# Patient Record
Sex: Female | Born: 1965 | Race: White | Hispanic: No | Marital: Married | Smoking: Current every day smoker
Health system: Southern US, Community
[De-identification: ages and names within clinical notes are randomized; demographics above are authoritative.]

## PROBLEM LIST (undated history)

## (undated) HISTORY — PX: CHOLECYSTECTOMY: SHX55

---

## 1998-11-27 ENCOUNTER — Emergency Department (HOSPITAL_COMMUNITY): Admission: EM | Admit: 1998-11-27 | Discharge: 1998-11-27 | Payer: Self-pay | Admitting: Internal Medicine

## 1998-11-28 ENCOUNTER — Encounter: Payer: Self-pay | Admitting: Emergency Medicine

## 2003-07-27 ENCOUNTER — Inpatient Hospital Stay (HOSPITAL_COMMUNITY): Admission: EM | Admit: 2003-07-27 | Discharge: 2003-08-03 | Payer: Self-pay | Admitting: Emergency Medicine

## 2003-07-27 ENCOUNTER — Encounter (INDEPENDENT_AMBULATORY_CARE_PROVIDER_SITE_OTHER): Payer: Self-pay | Admitting: *Deleted

## 2005-04-25 IMAGING — CT CT ABDOMEN W/ CM
1 of 4 series · 14 of 32 positions shown, 19 images · IV contrast (omnipaque)
Comparison: none

CLINICAL DATA: cholecystectomy; jaundice
 CT SCAN OF THE ABDOMEN WITH CONTRAST
 Spiral scanning is performed after oral administration of diluted contrast and during intravenous administration of 911cc of Omnipaque 300.  
 The lung bases show mild dependent atelectasis.  The patient has had recent cholecystectomy with a drain in place in the subhepatic area.  There is not any unexpected amount of fluid.  There is a small amount of fluid in the subhepatic region but I do not imagine this is of any concern.  The liver parenchyma does not show any focal lesion.  No sign of biliary ductal dilatation.  The spleen, pancreas, adrenal glands and kidneys all appear normal.  The aorta and IVC are normal.  No bowel pathology is seen.  No free air.  
 IMPRESSION
 Status post cholecystectomy with the drain well positioned.  No sign of biliary ductal obstruction, focal liver lesion or any significant leakage of fluid that is not being removed by the drainage catheter.
 CT SCAN OF THE PELVIS
   Spiral scanning is performed after oral and intravenous contrast administration.  
 There is no free fluid.  The patient has either bilateral hydrosalpinx or bilateral ovarian cysts.  The uterus appears unremarkable.  No sign of mass or adenopathy.  
 No acute finding in the pelvis.  Possible bilateral hydrosalpinx or ovarian cysts.

[Series 3: — · axial · 0.60mm/px · z∈[-574,-184]mm · 14 of 90 slices shown, 19 images]
[im 6/90  soft-tissue]
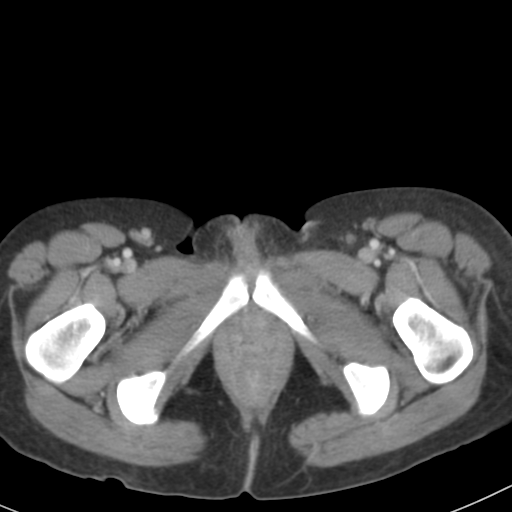
[im 6/90  bone]
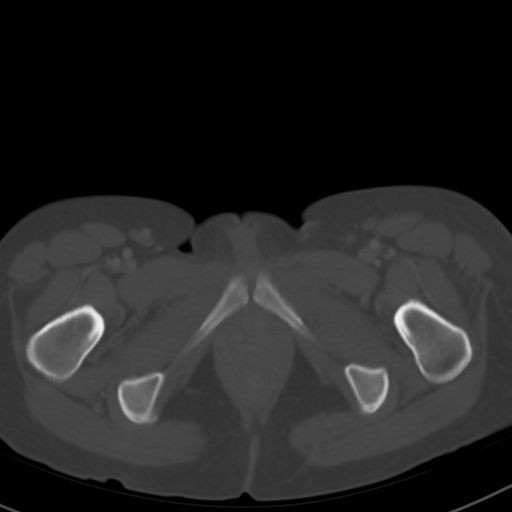
[im 12/90  soft-tissue]
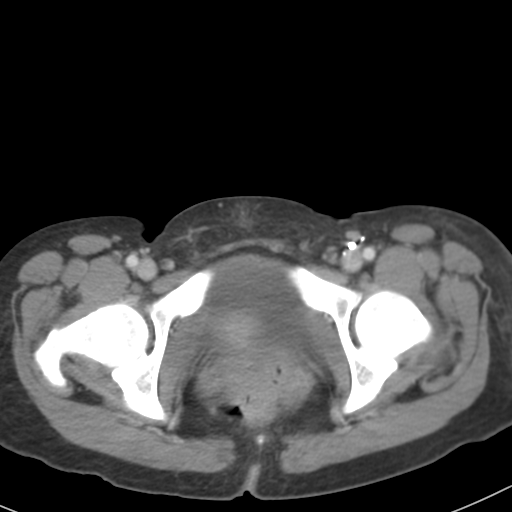
[im 17/90  soft-tissue]
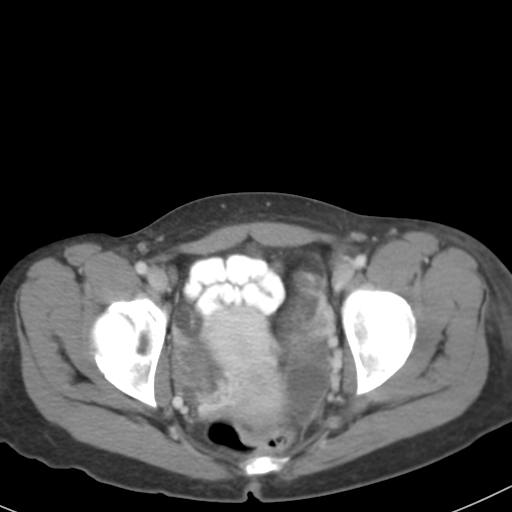
[im 28/90  soft-tissue]
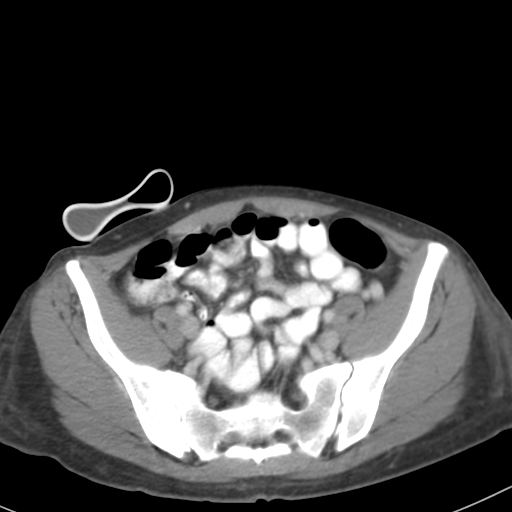
[im 34/90  soft-tissue]
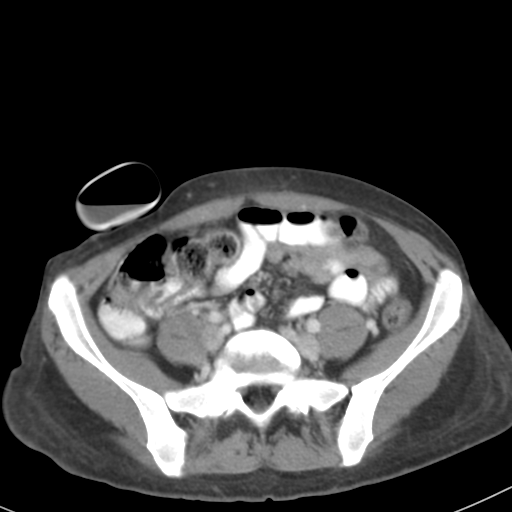
[im 39/90  soft-tissue]
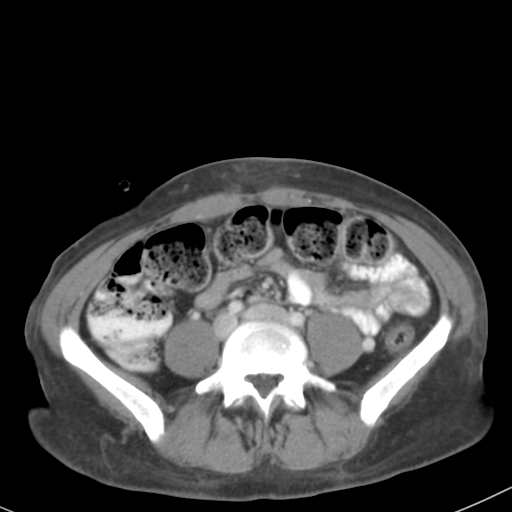
[im 45/90  soft-tissue]
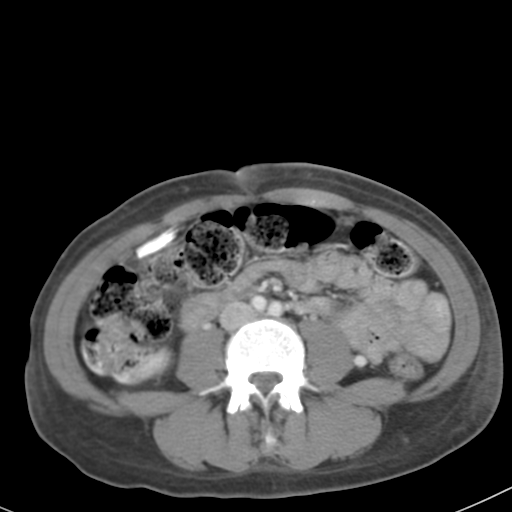
[im 51/90  soft-tissue]
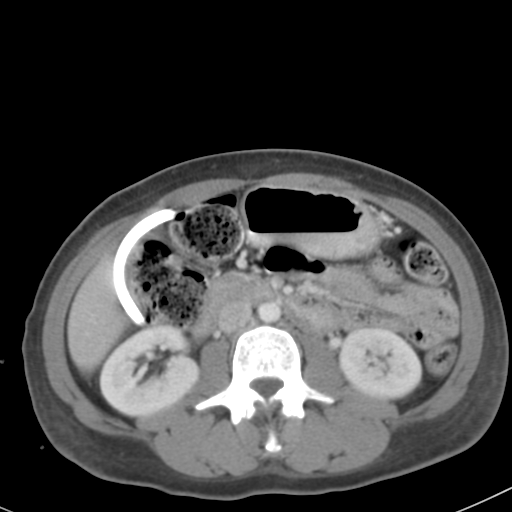
[im 56/90  soft-tissue]
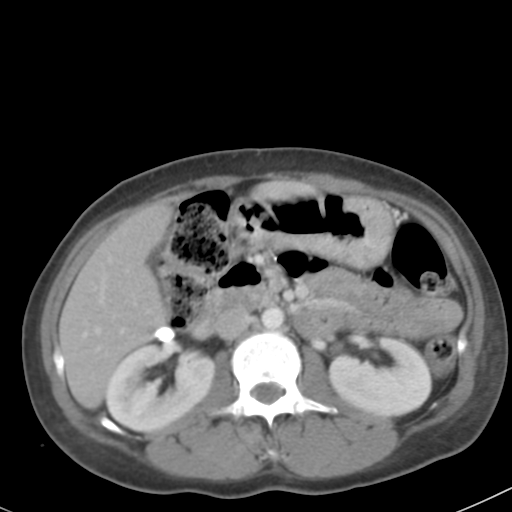
[im 56/90  bone]
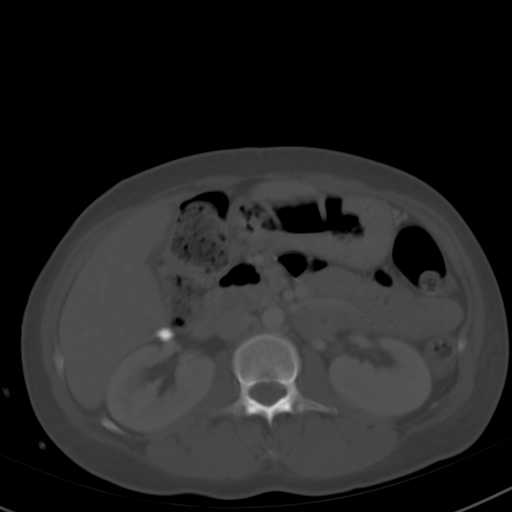
[im 62/90  soft-tissue]
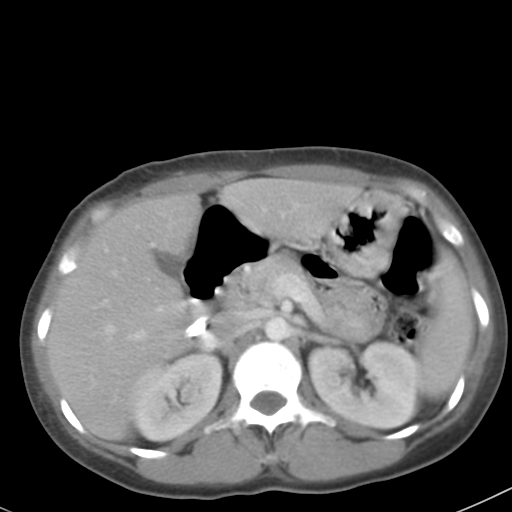
[im 67/90  lung]
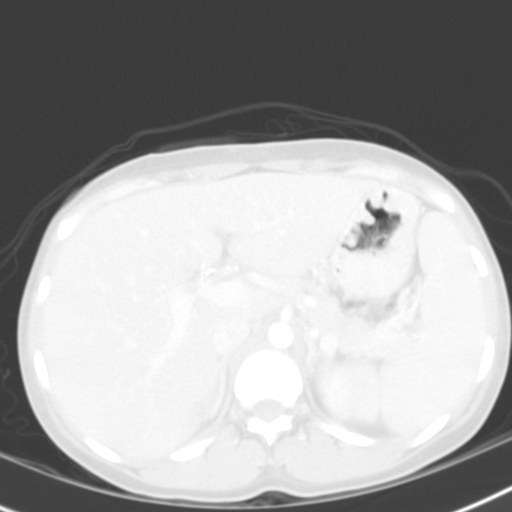
[im 73/90  soft-tissue]
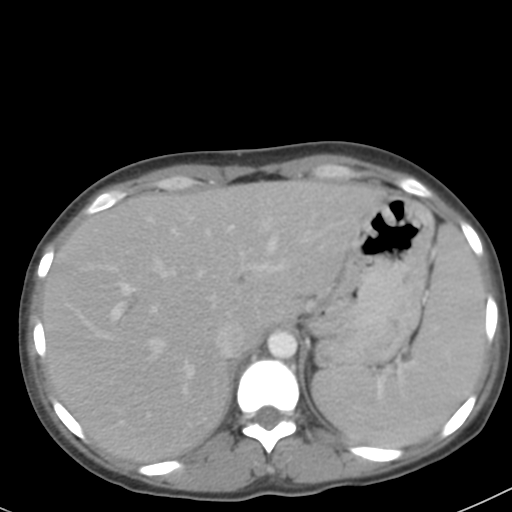
[im 73/90  lung]
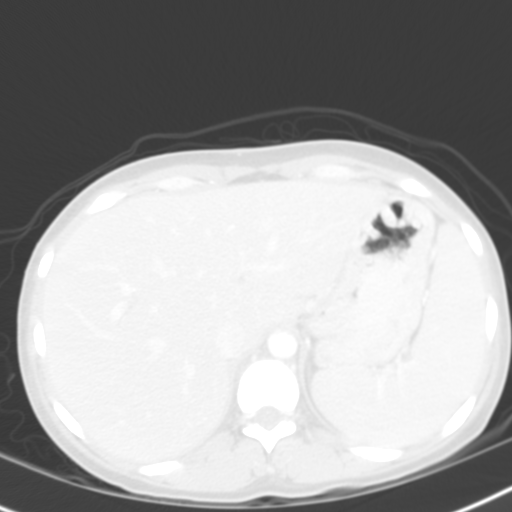
[im 78/90  soft-tissue]
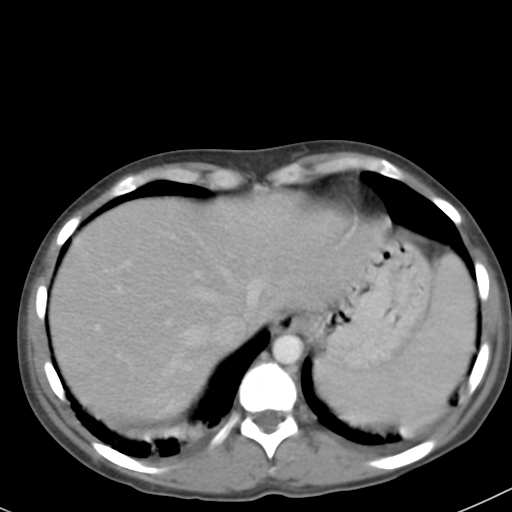
[im 78/90  lung]
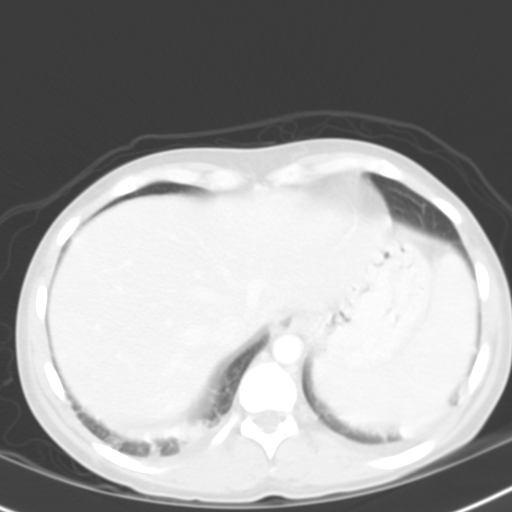
[im 84/90  soft-tissue]
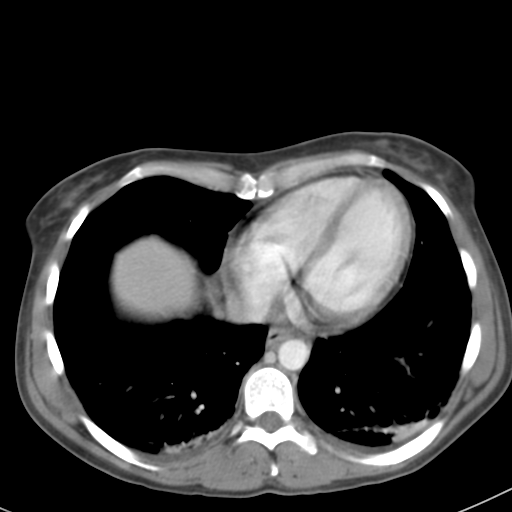
[im 84/90  lung]
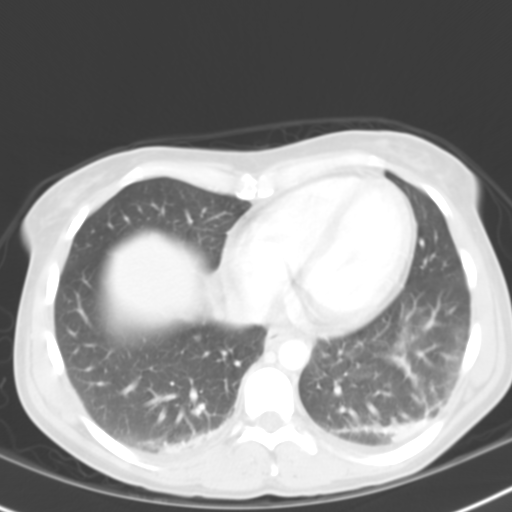

[14 of 32 positions shown; findings below may reference images not displayed]

## 2008-05-09 ENCOUNTER — Emergency Department (HOSPITAL_COMMUNITY): Admission: EM | Admit: 2008-05-09 | Discharge: 2008-05-09 | Payer: Self-pay | Admitting: Emergency Medicine

## 2009-10-14 ENCOUNTER — Observation Stay (HOSPITAL_COMMUNITY): Admission: EM | Admit: 2009-10-14 | Discharge: 2009-10-15 | Payer: Self-pay | Admitting: Emergency Medicine

## 2010-06-05 LAB — URINALYSIS, ROUTINE W REFLEX MICROSCOPIC
Bilirubin Urine: NEGATIVE
Glucose, UA: NEGATIVE mg/dL
Hgb urine dipstick: NEGATIVE
Specific Gravity, Urine: 1.026 (ref 1.005–1.030)
Urobilinogen, UA: 0.2 mg/dL (ref 0.0–1.0)
pH: 5 (ref 5.0–8.0)

## 2010-06-05 LAB — RAPID URINE DRUG SCREEN, HOSP PERFORMED
Barbiturates: NOT DETECTED
Benzodiazepines: NOT DETECTED
Cocaine: NOT DETECTED
Opiates: NOT DETECTED

## 2010-06-05 LAB — POCT CARDIAC MARKERS
CKMB, poc: 1.1 ng/mL (ref 1.0–8.0)
CKMB, poc: 1.5 ng/mL (ref 1.0–8.0)
Myoglobin, poc: 69.2 ng/mL (ref 12–200)
Troponin i, poc: <0.05 ng/mL (ref 0.00–0.09)

## 2010-06-05 LAB — POCT I-STAT, CHEM 8
BUN: 12 mg/dL (ref 6–23)
Chloride: 110 mEq/L (ref 96–112)
Glucose, Bld: 96 mg/dL (ref 70–99)
Potassium: 3.5 mEq/L (ref 3.5–5.1)
TCO2: 23 mmol/L (ref 0–100)

## 2010-06-05 LAB — CBC
MCH: 31.8 pg (ref 26.0–34.0)
MCV: 93.6 fL (ref 78.0–100.0)
RBC: 3.85 MIL/uL — ABNORMAL LOW (ref 3.87–5.11)
RDW: 13.9 % (ref 11.5–15.5)
WBC: 6.8 10*3/uL (ref 4.0–10.5)

## 2010-06-05 LAB — LIPID PANEL
Cholesterol: 151 mg/dL (ref 0–200)
HDL: 37 mg/dL — ABNORMAL LOW (ref >39)
Triglycerides: 118 mg/dL (ref <150)

## 2010-06-05 LAB — URINE MICROSCOPIC-ADD ON

## 2010-06-05 LAB — DIFFERENTIAL
Basophils Absolute: 0 10*3/uL (ref 0.0–0.1)
Eosinophils Relative: 3 % (ref 0–5)
Monocytes Absolute: 0.4 10*3/uL (ref 0.1–1.0)
Monocytes Relative: 6 % (ref 3–12)
Neutrophils Relative %: 72 % (ref 43–77)

## 2010-06-05 LAB — CARDIAC PANEL(CRET KIN+CKTOT+MB+TROPI)
Relative Index: 1.7 (ref 0.0–2.5)
Total CK: 100 U/L (ref 7–177)

## 2010-06-05 LAB — CK TOTAL AND CKMB (NOT AT ARMC): CK, MB: 2 ng/mL (ref 0.3–4.0)

## 2010-06-05 LAB — TROPONIN I: Troponin I: 0.01 ng/mL (ref 0.00–0.06)

## 2010-07-06 LAB — POCT I-STAT, CHEM 8
BUN: 14 mg/dL (ref 6–23)
Chloride: 110 mEq/L (ref 96–112)
Glucose, Bld: 161 mg/dL — ABNORMAL HIGH (ref 70–99)
Hemoglobin: 16 g/dL — ABNORMAL HIGH (ref 12.0–15.0)
Potassium: 3.6 mEq/L (ref 3.5–5.1)
Sodium: 140 mEq/L (ref 135–145)
TCO2: 21 mmol/L (ref 0–100)

## 2010-07-06 LAB — CBC
HCT: 43.3 % (ref 36.0–46.0)
MCHC: 35 g/dL (ref 30.0–36.0)
MCV: 95.2 fL (ref 78.0–100.0)
RDW: 13.3 % (ref 11.5–15.5)

## 2010-07-06 LAB — DIFFERENTIAL
Basophils Relative: 0 % (ref 0–1)
Eosinophils Absolute: 0.1 10*3/uL (ref 0.0–0.7)
Lymphocytes Relative: 1 % — ABNORMAL LOW (ref 12–46)
Lymphs Abs: 0.2 10*3/uL — ABNORMAL LOW (ref 0.7–4.0)
Monocytes Relative: 3 % (ref 3–12)
Neutrophils Relative %: 96 % — ABNORMAL HIGH (ref 43–77)

## 2010-07-06 LAB — URINALYSIS, ROUTINE W REFLEX MICROSCOPIC
Bilirubin Urine: NEGATIVE
Glucose, UA: NEGATIVE mg/dL
Protein, ur: NEGATIVE mg/dL
Urobilinogen, UA: 0.2 mg/dL (ref 0.0–1.0)
pH: 5 (ref 5.0–8.0)

## 2010-08-06 NOTE — Discharge Summary (Signed)
NAMETAKEILA, [name] NO.:  [id number]   MEDICAL RECORD NO.:  [id number]                   PATIENT TYPE:  INP   LOCATION:  0379                                 FACILITY:  [facility]   PHYSICIAN:  Angelia Mould. Derrell Lolling, M.D.             DATE OF BIRTH:  [birth year]   DATE OF ADMISSION:  07/27/2003  DATE OF DISCHARGE:  08/03/2003                                 DISCHARGE SUMMARY   FINAL DIAGNOSES:  1. Acute cholecystitis with cholelithiasis.  2. Postoperative febrile illness including leukopenia, thrombocytopenia, and     abnormal liver function tests.  Drug reaction suspected, resolving at the     time of discharge.   OPERATION:  Laparoscopic cholecystectomy with intraoperative cholangiogram  on 07/27/2003.   HISTORY:  This is a 45 year old white female who presented with a 12-hour  history of epigastric and right upper quadrant pain, vomiting, and fever to  103 degrees. She was evaluated in the emergency room and found to have  gallstones on ultrasound.  Her ultrasound showed a 1.8 cm gallstone in the  neck of the gallbladder and edema of the wall of the gallbladder.  I was  called to see her.   PHYSICAL EXAMINATION:  A younger, white female, pleasant, in mild-to-  moderate distress.  She was alert, but quite anxious.  Temperature 103.3,  pulse 84, respirations 18.  Initial blood pressure 76/37; repeat blood  pressure 109/70.  Her exam was unremarkable except for abdominal tenderness  and guarding in the right upper quadrant, but there was no palpable mass.   ADMISSION DATA:  Hemoglobin was 11.9, white blood cell count was low at  3300.  She had mild elevations of her SGOT and SGPT, but her total bilirubin  was normal at 0.7.   HOSPITAL COURSE:  I felt that the patient had significant acute  cholecystitis and I elected to take her to the operating room the same day.  She underwent laparoscopic cholecystectomy with cholangiogram. Her  gallbladder was  acutely inflamed. Removal of the gallbladder was uneventful.  The cholangiogram showed normal biliary anatomy, no obstruction, and no  filling defect, but there was a somewhat amorphous fluid collection around  the duodenal bulb.  This was discussed with radiology and they felt that it  was simply a manifestation of the duodenal bulb. They found no retained  stones or abnormalities.   Postoperatively the patient initially looked fine.  We repeated her blood  work the following morning and her white blood cell count was still 2,900;  we were not really sure why.  The following morning 07/29/2003 she looked  well, but had a fever to 102 degrees.  She was tolerating her diet, had not  had any chills.  I was not really sure why this was.  We ordered chest x-ray  and lab work and her total bilirubin was up to 4.9.  Her chest x-ray showed  a little bit of atelectasis, as I recall, but nothing to suggest a cause for  fever. She had a Jackson-Pratt drain in which was draining very light  serosanguineous fluid, no bile whatsoever.  Because her liver function tests  went up, we did a hepatobiliary scan and that was normal.  There was no  obstruction or leak.  She continued her on antibiotics.  She continued to  have high fevers and did get a little bit of nausea and vomiting.  Her  bilirubin peaked at 5.1.   I asked Dr. Dorena Cookey and Dr. Roosvelt Harps to see her from a GI  standpoint; and they considered doing ERCP, but because the hepatobiliary  scan was normal I elected to hold off on that.  She was seen by Dr. Lenn Sink of the infectious disease service. It was his feeling that she most  likely had had a drug reaction, possibly to Phenergan and we discontinued  that. We also broadened her antibiotic to Primaxin.  She had numerous  serologies including hepatitis -- all of which were negative.  CK was  normal.  Urine myoglobins were normal.  There was no eosinophilia.  Multiple  blood  cultures were negative.  Urine pregnancy test was negative.  CMV  antibodies were negative.  ANA was negative.   The patient was simply observed over the next 2 or 3 days.  She did slowly  improve clinically.  She was seen by Dr. Marcelyn Bruins of the pulmonary  critical care medicine service for a single consultation.  It was his  opinion that the patient seemed to have some type of drug reaction.  He said  that he could not rule out a sepsis syndrome or autoimmune disease which  __________ less likely.  Over the next couple of days she improved.  She  resumed appetite and bowel function.  All of her cultures came back  negative.  Her total bilirubin went down to 2.5.   On 08/03/2003 she wanted to go home.  She had vomited the previously  evening, but had been eating Cheetos and was eating fine.  Clinically she  was well with a benign abdominal exam and her temperature was 97.6 and her  pulse was 60.  The last lab work on the chart showed a WBC of 3500,  hemoglobin of 9.8, platelet count of 128,000, total bilirubin 2.5, alkaline  phosphatase of 311.  She was asked to return to see me in the office in a  couple of weeks, sooner if she was ill in any way.                                               Angelia Mould. Derrell Lolling, M.D.    HMI/MEDQ  D:  08/19/2003  T:  08/19/2003  Job:  [id number]   cc:   Everardo All. Madilyn Fireman, M.D.  [address]  [place]  [place] [address]  Fax: [phone]   Rockey Situ. Flavia Shipper., M.D.  [address]  [place]  [place] [address]  Fax: [phone]

## 2010-08-06 NOTE — H&P (Signed)
[name], [name] NO.:  [id number]   MEDICAL RECORD NO.:  [id number]                   PATIENT TYPE:  EMS   LOCATION:  ED                                   FACILITY:  [facility]   PHYSICIAN:  Angelia Mould. Derrell Lolling, M.D.             DATE OF BIRTH:  [birth year]   DATE OF ADMISSION:  07/27/2003  DATE OF DISCHARGE:                                HISTORY & PHYSICAL   CHIEF COMPLAINT:  Abdominal pain, vomiting, fever.   HISTORY OF PRESENT ILLNESS:  This is a 45 year old white female who states  that she felt nauseated and vomited last night.  She did not have any pain  last night.  She awoke this morning at 7 a.m. with epigastric and right  upper quadrant pain.  This has become more severe during the day.  She has  vomited today.  She has had a fever up to 103.  She has not had any  diarrhea.  She had a similar attack 2 weeks ago which was much less severe.  She states that she was evaluated at age 70 at [facility] in [place] for a gallbladder attack, and was given pills to treat it.  She  admits to having a 5-10 pound weight loss over the last 3 months.  She is  here with her husband and her sister, who are in attendance throughout the  interview and the physical examination.   PAST HISTORY:  1. Kidney stones.  2. Lipoma excision, left flank.  3. Vein surgery.  4. Hospitalization in 1993 for umbilical infection.  (Otherwise, denies surgical or medical problems.)   CURRENT MEDICATIONS:  Nexium.   DRUG ALLERGIES:  PENICILLIN causes a skin rash.   SOCIAL HISTORY:  The patient lives in [place].  She is married.  They do  not have any children.  She has never been pregnant.  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  She smokes one-half pack of cigarettes per day.  Denies the use of alcohol.  Admits to smoking marijuana.   FAMILY HISTORY:  Mother living in her late 20's.  No known medical problems.  Father living in his early 56's with  heart disease and hypertension.  One  sister living and well.   REVIEW OF SYSTEMS:  All systems reviewed.  They are noncontributory, except  as described above.   PHYSICAL EXAMINATION:  GENERAL:  This is a relatively thin white female who  is in moderate distress.  She is somewhat agitated and angry, but she is  coherent and oriented.  VITAL SIGNS:  Temperature 103.3, pulse 84, respirations 18, blood pressure  122/72.  HEENT:  Eyes - sclerae are clear.  Extraocular movements intact.  Ears,  nose, mouth, and throat - nose, lips, tongue, and oropharynx are without  gross lesions.  She has upper dental plates.  NECK:  Supple, nontender.  No adenopathy,  no thyroid mass, no jugular venous  distention.  LUNGS:  Clear to auscultation.  No chest wall tenderness, but she is tender  to percussion at the right costovertebral angle.  HEART:  Regular rate and rhythm.  No murmurs.  BREASTS:  Not examined.  ABDOMEN:  Soft, not distended.  Hypoactive bowel sounds.  Very tender with  involuntary guarding in the right upper quadrant.  I do not feel at mass.  No hernias noted.  GENITALIA:  No inguinal adenopathy or mass.  EXTREMITIES:  She moves all four extremities well without pain or deformity.  NEUROLOGIC:  No gross motor or sensory deficits.   ADMISSION DATA:  Ultrasound shows a 1.8 cm gallstone impacted in the neck of  the gallbladder.  There is significant wall edema and tenderness, consistent  with acute cholecystitis.  No other abnormalities were written on the report  by the radiologist.  Hemoglobin 11.9, white blood cell count 3300.  Complete  metabolic panel is normal, with the exception of an SGOT of 68, and SGPT of  48.  Her total bilirubin and alkaline phosphatase were normal.  Serum  amylase was 62, lipase 22.  Urine pregnancy test negative.  Urinalysis is  essentially unremarkable.   ASSESSMENT:  1. Acute cholecystitis with cholelithiasis.  2. Tobacco abuse.  3. History of  umbilical infection.   PLAN:  1. The patient will be admitted and taken to the operating room for     cholecystectomy.  2. I have discussed the indications and details of this surgery with the     patient, her husband, and her sister.  I have drawn pictures showing the     anatomy.  Indications and details of the surgery were outlined.  Risks     and complications were outlined, including, but not limited to, bleeding,     infection, conversion to open laparotomy, injury to adjacent organs such     as the main bile duct or intestine with major reconstructive surgery.     Wound problems such as infection or hernia, cardiac, pulmonary, and     thromboembolic problems.  They seem to understand these issues well.  At     this time, all their questions were answered.  She is more than willing     to undergo this surgery tonight because of her pain and high fever.                                               Angelia Mould. Derrell Lolling, M.D.    HMI/MEDQ  D:  07/27/2003  T:  07/27/2003  Job:  [id number]

## 2010-08-06 NOTE — Op Note (Signed)
NAMESHANIQUIA, BRAFFORD NO.:  [id number]   MEDICAL RECORD NO.:  [id number]                   PATIENT TYPE:  INP   LOCATION:  0277                                 FACILITY:  [facility]   PHYSICIAN:  Angelia Mould. Derrell Lolling, M.D.             DATE OF BIRTH:  September 04, 1965   DATE OF PROCEDURE:  07/27/2003  DATE OF DISCHARGE:                                 OPERATIVE REPORT   PREOPERATIVE DIAGNOSES:  Acute cholecystitis with cholelithiasis.   POSTOPERATIVE DIAGNOSES:  Acute cholecystitis with cholelithiasis.   OPERATION PERFORMED:  Laparoscopic cholecystectomy with intraoperative  cholangiogram.   SURGEON:  Angelia Mould. Derrell Lolling, M.D.   FIRST ASSISTANT:  Sharlet Salina T. Hoxworth, M.D.   OPERATIVE INDICATIONS:  This is a 45 year old white female who presents with  a greater than 12 hour history of epigastric pain and right upper quadrant  pain, nausea, and vomiting.  She has had a fever to 103.  She came to the  emergency room where evaluation revealed ultrasound showing a 1.8 cm  gallstone impacted in the neck of the gallbladder and thickening of the  gallbladder wall consistent with acute cholecystitis.  She had mild  elevation of her SGOT and SGPT.  Her white blood cell count was only 3300  and her temperature was 103.  She was brought to the operating room promptly  for cholecystectomy.   OPERATIVE FINDINGS:  The gallbladder was severely acute inflamed and  edematous.  She had some adhesions in the right upper quadrant that were  chronic that had to be taken down suggesting prior inflammatory episodes of  unknown etiology.  She did have some perihepatic adhesions that were also  taken down.  The cholangiogram showed normal intrahepatic and extrahepatic  bile ducts, no filling defects, and prompt flow of contrast into the  duodenum.  At the duodenum fill, there was no amorphous area which suggested  a fold of the duodenum or perhaps the diverticulum, we were not sure.   We  carefully inspected the duodenum during the case, and at the end of the case  and did not find any injury to the duodenum.   OPERATIVE TECHNIQUE:  Following the induction of general endotracheal  anesthesia, the patient's abdomen was prepped and draped in a sterile  fashion.  Marcaine 0.5% with epinephrine was used as local infiltration  anesthetic.  A transverse incision was made at the lower rib of the  umbilicus.  The fascia was incised in the midline and the abdominal cavity  entered under direct vision.  A 10 mm Hasson trocar was inserted and secured  with a purse-string suture of 0-Vicryl.  Pneumoperitoneum was created.  Video camera was inserted with visualization and findings as described  above.  A 10 mm trocar was placed in the subxiphoid region and two 5 mm  trocars were placed in the right mid abdomen.  We carefully took down the  adhesions  in the right upper quadrant.  There were some perihepatic  adhesions which we took down from medial to lateral with traction and  countertraction and we ultimately identified the fundus of the gallbladder  and elevated that.  We then were able to peel all of the edematous adhesions  down off the gallbladder bed carefully.  We identified the duodenum  carefully as we did this and there was no apparent injury to the duodenum at  all.  We dissected out the cystic duct and cystic artery.  The cystic artery  was isolated as it went on the gallbladder wall, secured with metal clip and  divided.  We created a large window behind the cystic duct.  A metal clip  was placed on the cystic duct close to the gallbladder.  The cholangiogram  catheter was inserted into the cystic duct.  A cholangiogram was obtained  using the C-arm.  This showed normal biliary anatomy, both intrahepatic and  extrahepatic.  This showed that there was no filling defect or obstruction  as the contrast flowed into the duodenum.  After the contrast flowed into  the  duodenum, we also took a second film with the C-arm in the left anterior  oblique position and everything looked fine, although as we filled the  duodenum, there was one area of filling in the lumen of the gut which looked  somewhat amorphous and did not have a normal mucosal pattern.  We were not  sure of what this was but it did not seem to be extravasation.  The  cholangiogram catheter was removed.  The cystic duct was secured with  multiple metal clips and divided.  The gallbladder was dissected from its  bed with electrocautery and removed from the abdomen.  Hemostasis was  excellent and achieved with electrocautery.  We irrigated the subphrenic  space and subhepatic space with saline, a fair amount, until all off the  irrigation fluid was clear.  We spent a great deal of time looking at the  stomach and the duodenal C-loop, and we could visualize this quite nicely  and saw no bleeding or injury to the duodenum whatsoever.  Because of the  acute inflammation, we placed a #19 Blake drain in the subhepatic space,  brought this out in the right upper quadrant through one of the trocar sites  with incision to the skin with a nylon suture and connected it to a suction  bulb.  The trocars were removed under direct vision and there was no  bleeding from the trocar sites.  The pneumoperitoneum was released.  The  fascia at the umbilicus was closed with a 0-Vicryl suture.  The skin  incisions were closed with subcuticular sutures of 4-0 Vicryl and Steri-  Strips.  Bandages were placed and the patient was taken to the recovery room  in stable condition.  Estimated blood loss was about 25 mL.  No  complications.  Sponge, instrument, needle counts correct.                                               Angelia Mould. Derrell Lolling, M.D.    HMI/MEDQ  D:  07/27/2003  T:  07/28/2003  Job:  [id number]

## 2010-08-06 NOTE — Consult Note (Signed)
NAMELOWEN, MANSOURI NO.:  [id number]   MEDICAL RECORD NO.:  [id number]                   PATIENT TYPE:  INP   LOCATION:  0277                                 FACILITY:  [facility]   PHYSICIAN:  John C. Madilyn Fireman, M.D.                 DATE OF BIRTH:  [birth year]   DATE OF CONSULTATION:  07/30/2003  DATE OF DISCHARGE:                                   CONSULTATION   REASON FOR CONSULTATION:  Fever and elevated liver function tests after  cholecystectomy.   HISTORY OF ILLNESS:  The patient is a 45 year old white female who presented  with nausea and vomiting, and reportedly had a fever as high as 103 on Jul 26, 2003.  She had an abdominal ultrasound which showed a 1.8 cm gallstone  impacted in the neck of the gallbladder with significant wall edema.  Bilirubin and alkaline phosphatase were normal with an SGOT of 68 and SGPT  of 48.  Amylase and lipase were normal.  WBC count was slightly decreased at  3300.  The patient went to the operating room and underwent laparoscopic  cholecystectomy with intraoperative cholangiogram, which was normal.  Postoperatively, the patient had developed fevers.  She was afebrile for  approximately the first day of admission, but then began having higher and  higher fevers, which have now reached as high as 105.4.  She has had a  rising pulse into the 120's.  Her white blood cell count has been relatively  stable, between 2900 and 3200.  Her platelet count has fallen slightly from  189,000 to 121,000.  Her hemoglobin has gone from 11.9 to 10.5.  She has had  an expected amount of postoperative tenderness, but no severe abdominal  pain.  She has had nausea requiring Phenergan.  Her bilirubin rose from 0.7  on Jul 27, 2003, to 1.3 on Jul 28, 2003, to 4.9 on Jul 29, 2003, to 4.7 on Jul 30, 2003.  She has had minimal elevation of alkaline phosphatase , and her  SGOT and SGPT rose up into the 100's, but both fell from yesterday to  today.  She has significant malaise related to fevers, and only intermittent  abdominal pain.  She was somewhat lethargic, but arousable and oriented.  She had an abdominal CT scan and a PIPIDA scan today, which were both  interpreted as normal with no bile leak and no evidence of any biliary  obstruction.   PAST MEDICAL HISTORY:  1. Kidney stones.  2. Lipoma excised from the left flank.   MEDICATIONS:  Nexium.   ALLERGIES:  PENICILLIN.   SOCIAL HISTORY:  The patient lives in [place].  She is married.  She has  no children.  She smokes a half pack of cigarettes a day, and denies alcohol  use.   PHYSICAL EXAMINATION:  GENERAL:  Ill-appearing white female in moderate  distress, lethargic, but  appropriate, not complaining of pain.  VITAL SIGNS:  Temperature 105.4, pulse 120, blood pressure 120/80.  HEART:  Rapid regular rate without murmur.  ABDOMEN:  Soft.  Covered with bandages.  There is mild tenderness,  particularly around the incision areas, but no rebound.   IMPRESSION:  Fever associated with recent cholecystectomy and cholecystitis,  with a natural suspicion of cholangitis or common bile duct stones not  really borne out by studies to date.  I am more concerned about a  generalized sepsis syndrome of some sort, either within or outside the  biliary system.  She is currently on Cipro.  I had a long discussion with  Dr. Claud Kelp, and we both feel at this point there is no compelling  evidence for common bile duct stone or cholangitis as her primary source of  fever, and will hold off on ERCP, unless her overall condition deteriorates,  or the clinical picture points more strongly toward cholangitis.  We will  have Dr. Burnice Logan review her case from the standpoint of fever and  antibiotic selection, to see if he has any further ideas.  Drug fever or  more esoteric infections would certainly be possibilities.  I do not think  she has an acute viral hepatitis, due  to the minimal elevation of her liver  function tests in relation to her overall degree of illness and fever.                                               John C. Madilyn Fireman, M.D.    JCH/MEDQ  D:  07/30/2003  T:  07/30/2003  Job:  [id number]

## 2013-11-22 ENCOUNTER — Encounter (HOSPITAL_COMMUNITY): Payer: Self-pay | Admitting: Emergency Medicine

## 2013-11-22 ENCOUNTER — Emergency Department (INDEPENDENT_AMBULATORY_CARE_PROVIDER_SITE_OTHER)
Admission: EM | Admit: 2013-11-22 | Discharge: 2013-11-22 | Disposition: A | Discharge status: Home / Self Care | Payer: 59 | Source: Home / Self Care | Attending: Family Medicine | Admitting: Family Medicine

## 2013-11-22 DIAGNOSIS — M25569 Pain in unspecified knee: Secondary | ICD-10-CM

## 2013-11-22 DIAGNOSIS — M25561 Pain in right knee: Secondary | ICD-10-CM

## 2013-11-22 MED ORDER — METHYLPREDNISOLONE ACETATE 40 MG/ML IJ SUSP
40.0000 mg | Freq: Once | INTRAMUSCULAR | Status: AC
  Administered 2013-11-22: 40 mg via INTRA_ARTICULAR

## 2013-11-22 MED ORDER — BUPIVACAINE HCL (PF) 0.5 % IJ SOLN
INTRAMUSCULAR | Status: AC
  Filled 2013-11-22: qty 10

## 2013-11-22 MED ORDER — DICLOFENAC SODIUM 50 MG PO TBEC: 50.0000 mg | DELAYED_RELEASE_TABLET | Freq: Two times a day (BID) | ORAL | Status: DC | PRN

## 2013-11-22 MED ORDER — METHYLPREDNISOLONE ACETATE 40 MG/ML IJ SUSP
INTRAMUSCULAR | Status: AC
  Filled 2013-11-22: qty 5

## 2013-11-22 NOTE — ED Provider Notes (Signed)
[name] is a 48 y.o. female who presents to Urgent Care today for right knee pain. Patient has a 3-4 week history of right knee pain. The pain started after the patient twisted her knee stepping off a curb 4 weeks ago. She felt a pop and had some mild swelling. She's had similar incidences over the past several weeks as well. She notes some pain and swelling. She's tried a knee brace rest ice and elevation which have not helped. She denies any radiating pain locking or catching fevers or chills nausea vomiting or diarrhea.   History reviewed. No pertinent past medical history. History  Substance Use Topics  . Smoking status: Current Every Day Smoker  . Smokeless tobacco: Not on file  . Alcohol Use: No   ROS as above Medications: No current facility-administered medications for this encounter.   Current Outpatient Prescriptions  Medication Sig Dispense Refill  . diclofenac (VOLTAREN) 50 MG EC tablet Take 1 tablet (50 mg total) by mouth 2 (two) times daily as needed.  60 tablet  0    Exam:  BP 122/78  Pulse 70  Temp(Src) 98.1 F (36.7 C) (Oral)  Resp 12  SpO2 100% Gen: Well NAD HEENT: EOMI,  MMM Lungs: Normal work of breathing. CTABL Heart: RRR no MRG  Exts: Brisk capillary refill, warm and well perfused.   right knee: Mild effusion. Range of motion 0-120 with 1+ for patellar crepitations. Tender palpation medial joint line and posterior lateral corner. Positive medial and lateral McMurray's tests. Normal stable nontender valgus and varus stress testing. Anterior drawer and posterior drawer testing are normal. Capillary refill pulses and sensation are intact distal bilateral lower extremities.   Knee injection. Right Consent obtained and timeout performed. Patient seated in a comfortable position with legs hanging off the table.  The medial Peri-patellar tendon space was palpated and marked. The skin was then cleaned with alcohol. Cold spray applied. A 25-gauge inch  and a half needle was used to inject 40 mg of Depo-Medrol and 4 mL of Marcaine. Patient tolerated procedure well no bleeding. Pain improved following injection  No results found for this or any previous visit (from the past 24 hour(s)). No results found.  Assessment and Plan: 48 y.o. female with right knee pain likely due to meniscus injury versus DJD. Discussed options for patient. She would like to attempt trial of corticosteroid injection before proceeding with further workup. Injection performed as above. Will additionally prescribe diclofenac. Patient will followup with orthopedics if not better significantly within the next several weeks.  Discussed warning signs or symptoms. Please see discharge instructions. Patient expresses understanding.   This note was created using Conservation officer, historic buildings. Any transcription errors are unintended.    Rodolph Bong, MD 11/22/13 [phone]

## 2013-11-22 NOTE — Discharge Instructions (Signed)
Thank you for coming in today. Call or go to the ER if you develop a large red swollen joint with extreme pain or oozing puss.  Take diclofenac as needed for pain. Followup with Dr. Farris Has at [facility]  Meniscus Tear with Phase I Rehab The meniscus is a C-shaped cartilage structure, located in the knee joint between the thigh bone (femur) and the shinbone (tibia). Two menisci are located in each knee joint: the inner and outer meniscus. The meniscus acts as an adapter between the thigh bone and shinbone, allowing them to fit properly together. It also functions as a shock absorber, to reduce the stress placed on the knee joint and to help supply nutrients to the knee joint cartilage. As people age, the meniscus begins to harden and become more vulnerable to injury. Meniscus tears are a common injury, especially in older athletes. Inner meniscus tears are more common than outer meniscus tears.  SYMPTOMS   Pain in the knee, especially with standing or squatting with the affected leg.  Tenderness along the joint line.  Swelling in the knee joint (effusion), usually starting 1 to 2 days after injury.  Locking or catching of the knee joint, causing inability to straighten the knee completely.  Giving way or buckling of the knee. CAUSES  A meniscus tear occurs when a force is placed on the meniscus that is greater than it can handle. Common causes of injury include:  Direct hit (trauma) to the knee.  Twisting, pivoting, or cutting (rapidly changing direction while running), kneeling or squatting.  Without injury, due to aging. RISK INCREASES WITH:  Contact sports (football, rugby).  Sports in which cleats are used with pivoting (soccer, lacrosse) or sports in which good shoe grip and sudden change in direction are required (racquetball, basketball, squash).  Previous knee injury.  Associated knee injury, particularly ligament injuries.  Poor strength and  flexibility. PREVENTION  Warm up and stretch properly before activity.  Maintain physical fitness:  Strength, flexibility, and endurance.  Cardiovascular fitness.  Protect the knee with a brace or elastic bandage.  Wear properly fitted protective equipment (proper cleats for the surface). PROGNOSIS  Sometimes, meniscus tears heal on their own. However, definitive treatment requires surgery, followed by at least 6 weeks of recovery.  RELATED COMPLICATIONS   Recurring symptoms that result in a chronic problem.  Repeated knee injury, especially if sports are resumed too soon after injury or surgery.  Progression of the tear (the tear gets larger), if untreated.  Arthritis of the knee in later years (with or without surgery).  Complications of surgery, including infection, bleeding, injury to nerves (numbness, weakness, paralysis) continued pain, giving way, locking, nonhealing of meniscus (if repaired), need for further surgery, and knee stiffness (loss of motion). TREATMENT  Treatment first involves the use of ice and medicine, to reduce pain and inflammation. You may find using crutches to walk more comfortable. However, it is okay to bear weight on the injured knee, if the pain will allow it. Surgery is often advised as a definitive treatment. Surgery is performed through an incision near the joint (arthroscopically). The torn piece of the meniscus is removed, and if possible the joint cartilage is repaired. After surgery, the joint must be restrained. After restraint, it is important to perform strengthening and stretching exercises to help regain strength and a full range of motion. These exercises may be completed at home or with a therapist.  MEDICATION  If pain medicine is needed, nonsteroidal anti-inflammatory medicines (  aspirin and ibuprofen), or other minor pain relievers (acetaminophen), are often advised.  Do not take pain medicine for 7 days before surgery.  Prescription  pain relievers may be given, if your caregiver thinks they are needed. Use only as directed and only as much as you need. HEAT AND COLD  Cold treatment (icing) should be applied for 10 to 15 minutes every 2 to 3 hours for inflammation and pain, and immediately after activity that aggravates your symptoms. Use ice packs or an ice massage.  Heat treatment may be used before performing stretching and strengthening activities prescribed by your caregiver, physical therapist, or athletic trainer. Use a heat pack or a warm water soak. SEEK MEDICAL CARE IF:   Symptoms get worse or do not improve in 2 weeks, despite treatment.  New, unexplained symptoms develop. (Drugs used in treatment may produce side effects.) EXERCISES RANGE OF MOTION (ROM) AND STRETCHING EXERCISES - Meniscus Tear, Non-operative, Phase I These are some of the initial exercises with which you may start your rehabilitation program, until you see your caregiver again or until your symptoms are resolved. Remember:   These initial exercises are intended to be gentle. They will help you restore motion without increasing any swelling.  Completing these exercises allows less painful movement and prepares you for the more aggressive strengthening exercises in Phase II.  An effective stretch should be held for at least 30 seconds.  A stretch should never be painful. You should only feel a gentle lengthening or release in the stretched tissue. RANGE OF MOTION - Knee Flexion, Active  Lie on your back with both knees straight. (If this causes back discomfort, bend your healthy knee, placing your foot flat on the floor.)  Slowly slide your heel back toward your buttocks until you feel a gentle stretch in the front of your knee or thigh.  Hold for __________ seconds. Slowly slide your heel back to the starting position. Repeat __________ times. Complete this exercise __________ times per day.  RANGE OF MOTION - Knee Flexion and Extension,  Active-Assisted  Sit on the edge of a table or chair with your thighs firmly supported. It may be helpful to place a folded towel under the end of your right / left thigh.  Flexion (bending): Place the ankle of your healthy leg on top of the other ankle. Use your healthy leg to gently bend your right / left knee until you feel a mild tension across the top of your knee.  Hold for __________ seconds.  Extension (straightening): Switch your ankles so your right / left leg is on top. Use your healthy leg to straighten your right / left knee until you feel a mild tension on the backside of your knee.  Hold for __________ seconds. Repeat __________ times. Complete __________ times per day. STRETCH - Knee Flexion, Supine  Lie on the floor with your right / left heel and foot lightly touching the wall. (Place both feet on the wall if you do not use a door frame.)  Without using any effort, allow gravity to slide your foot down the wall slowly until you feel a gentle stretch in the front of your right / left knee.  Hold this stretch for __________ seconds. Then return the leg to the starting position, using your healthy leg for help, if needed. Repeat __________ times. Complete this stretch __________ times per day.  STRETCH - Knee Extension Sitting  Sit with your right / left leg/heel propped on another chair, coffee  table, or foot stool.  Allow your leg muscles to relax, letting gravity straighten out your knee.*  You should feel a stretch behind your right / left knee. Hold this position for __________ seconds. Repeat __________ times. Complete this stretch __________ times per day.  *Your physician, physical therapist or athletic trainer may instruct you place a __________ weight on your thigh, just above your kneecap, to deepen the stretch.  STRENGTHENING EXERCISES - Meniscus Tear, Non-operative, Phase I These exercises may help you when beginning to rehabilitate your injury. They may  resolve your symptoms with or without further involvement from your physician, physical therapist or athletic trainer. While completing these exercises, remember:   Muscles can gain both the endurance and the strength needed for everyday activities through controlled exercises.  Complete these exercises as instructed by your physician, physical therapist or athletic trainer. Progress the resistance and repetitions only as guided. STRENGTH - Quadriceps, Isometrics  Lie on your back with your right / left leg extended and your opposite knee bent.  Gradually tense the muscles in the front of your right / left thigh. You should see either your knee cap slide up toward your hip or increased dimpling just above the knee. This motion will push the back of the knee down toward the floor, mat, or bed on which you are lying.  Hold the muscle as tight as you can, without increasing your pain, for __________ seconds.  Relax the muscles slowly and completely between each repetition. Repeat __________ times. Complete this exercise __________ times per day.  STRENGTH - Quadriceps, Short Arcs   Lie on your back. Place a __________ inch towel roll under your right / left knee, so that the knee bends slightly.  Raise only your lower leg by tightening the muscles in the front of your thigh. Do not allow your thigh to rise.  Hold this position for __________ seconds. Repeat __________ times. Complete this exercise __________ times per day.  OPTIONAL ANKLE WEIGHTS: Begin with ____________________, but DO NOT exceed ____________________. Increase in 1 pound/0.5 kilogram increments. STRENGTH - Quadriceps, Straight Leg Raises  Quality counts! Watch for signs that the quadriceps muscle is working, to be sure you are strengthening the correct muscles and not "cheating" by substituting with healthier muscles.  Lay on your back with your right / left leg extended and your opposite knee bent.  Tense the muscles in  the front of your right / left thigh. You should see either your knee cap slide up or increased dimpling just above the knee. Your thigh may even shake a bit.  Tighten these muscles even more and raise your leg 4 to 6 inches off the floor. Hold for __________ seconds.  Keeping these muscles tense, lower your leg.  Relax the muscles slowly and completely in between each repetition. Repeat __________ times. Complete this exercise __________ times per day.  STRENGTH - Hamstring, Curls   Lay on your stomach with your legs extended. (If you lay on a bed, your feet may hang over the edge.)  Tighten the muscles in the back of your thigh to bend your right / left knee up to 90 degrees. Keep your hips flat on the bed.  Hold this position for __________ seconds.  Slowly lower your leg back to the starting position. Repeat __________ times. Complete this exercise __________ times per day.  STRENGTH - Quadriceps, Squats  Stand in a door frame so that your feet and knees are in line with the frame.  Use your hands for balance, not support, on the frame.  Slowly lower your weight, bending at the hips and knees. Keep your lower legs upright so that they are parallel with the door frame. Squat only within the range that does not increase your knee pain. Never let your hips drop below your knees.  Slowly return upright, pushing with your legs, not pulling with your hands. Repeat __________ times. Complete this exercise __________ times per day.  STRENGTH - Quad/VMO, Isometric   Sit in a chair with your right / left knee slightly bent. With your fingertips, feel the VMO muscle just above the inside of your knee. The VMO is important in controlling the position of your kneecap.  Keeping your fingertips on this muscle. Without actually moving your leg, attempt to drive your knee down as if straightening your leg. You should feel your VMO tense. If you have a difficult time, you may wish to try the same  exercise on your healthy knee first.  Tense this muscle as hard as you can without increasing any knee pain.  Hold for __________ seconds. Relax the muscles slowly and completely in between each repetition. Repeat __________ times. Complete exercise __________ times per day.  Document Released: 03/21/1998 Document Revised: 05/30/2011 Document Reviewed: 06/19/2008 [facility] Patient Information 2015 [place], [place]. This information is not intended to replace advice given to you by your health care provider. Make sure you discuss any questions you have with your health care provider.

## 2018-12-18 ENCOUNTER — Other Ambulatory Visit: Payer: Self-pay | Admitting: Orthopedic Surgery

## 2018-12-18 DIAGNOSIS — M259 Joint disorder, unspecified: Secondary | ICD-10-CM

## 2018-12-25 ENCOUNTER — Other Ambulatory Visit: Payer: Self-pay

## 2018-12-25 ENCOUNTER — Ambulatory Visit
Admission: RE | Admit: 2018-12-25 | Discharge: 2018-12-25 | Disposition: A | Discharge status: Home / Self Care | Payer: Worker's Compensation | Source: Ambulatory Visit | Attending: Orthopedic Surgery | Admitting: Orthopedic Surgery

## 2018-12-25 DIAGNOSIS — M259 Joint disorder, unspecified: Secondary | ICD-10-CM

## 2019-01-31 ENCOUNTER — Other Ambulatory Visit: Payer: Self-pay | Admitting: Orthopedic Surgery

## 2019-02-07 ENCOUNTER — Other Ambulatory Visit: Payer: Self-pay | Admitting: Orthopedic Surgery

## 2019-02-07 DIAGNOSIS — M259 Joint disorder, unspecified: Secondary | ICD-10-CM

## 2019-02-19 ENCOUNTER — Other Ambulatory Visit: Payer: Self-pay

## 2019-02-19 ENCOUNTER — Ambulatory Visit
Admission: RE | Admit: 2019-02-19 | Discharge: 2019-02-19 | Disposition: A | Discharge status: Home / Self Care | Payer: Worker's Compensation | Source: Ambulatory Visit | Attending: Orthopedic Surgery | Admitting: Orthopedic Surgery

## 2019-02-19 DIAGNOSIS — M259 Joint disorder, unspecified: Secondary | ICD-10-CM

## 2019-02-19 MED ORDER — METHYLPREDNISOLONE ACETATE 40 MG/ML INJ SUSP (RADIOLOG
120.0000 mg | Freq: Once | INTRAMUSCULAR | Status: AC
  Administered 2019-02-19: 120 mg via INTRA_ARTICULAR

## 2019-05-22 ENCOUNTER — Encounter: Payer: Self-pay | Admitting: Nurse Practitioner

## 2019-05-22 ENCOUNTER — Other Ambulatory Visit: Payer: Self-pay

## 2019-05-22 ENCOUNTER — Ambulatory Visit: Payer: Self-pay | Attending: Nurse Practitioner | Admitting: Nurse Practitioner

## 2019-05-22 VITALS — Ht 61.0 in

## 2019-05-22 DIAGNOSIS — Z130 Encounter for screening for diseases of the blood and blood-forming organs and certain disorders involving the immune mechanism: Secondary | ICD-10-CM

## 2019-05-22 DIAGNOSIS — Z13228 Encounter for screening for other metabolic disorders: Secondary | ICD-10-CM

## 2019-05-22 DIAGNOSIS — G8929 Other chronic pain: Secondary | ICD-10-CM

## 2019-05-22 DIAGNOSIS — Z13220 Encounter for screening for lipoid disorders: Secondary | ICD-10-CM

## 2019-05-22 DIAGNOSIS — M5441 Lumbago with sciatica, right side: Secondary | ICD-10-CM

## 2019-05-22 DIAGNOSIS — Z131 Encounter for screening for diabetes mellitus: Secondary | ICD-10-CM

## 2019-05-22 MED ORDER — LIDOCAINE 5 % EX PTCH: 1.0000 | MEDICATED_PATCH | CUTANEOUS | 2 refills | Status: AC

## 2019-05-22 MED FILL — LIDOCAINE 5 % PTCH: 5 | 30 days supply | Qty: 30 | Fill #0

## 2019-05-22 NOTE — Progress Notes (Signed)
Virtual Visit via Telephone Note Due to national recommendations of social distancing due to [place] 19, telehealth visit is felt to be most appropriate for this patient at this time.  I discussed the limitations, risks, security and privacy concerns of performing an evaluation and management service by telephone and the availability of in person appointments. I also discussed with the patient that there may be a patient responsible charge related to this service. The patient expressed understanding and agreed to proceed.    I connected with [name] on 05/22/19  at   9:30 AM EST  EDT by telephone and verified that I am speaking with the correct person using two identifiers.   Consent I discussed the limitations, risks, security and privacy concerns of performing an evaluation and management service by telephone and the availability of in person appointments. I also discussed with the patient that there may be a patient responsible charge related to this service. The patient expressed understanding and agreed to proceed.   Location of Patient: Private  Residence    Location of Provider: [place] and [place] participating in Telemedicine visit: Geryl Rankins FNP-BC [place]    History of Present Illness: Telemedicine visit for: Establish care  has no past medical history on file.  Chronic Back Pain  Per Ortho Notes 03-11-2019 [name] is a pleasant 54 year old female who was In her normal state of health until 10/10/18 when she was lifting A box at work and began experiencing right-sided low back/buttock pain. She reports 100% relief following her Initial diagnostic right SI joint injection for a few hours. Unfortunately, she reports no relief from her repeated fluoroscopy guided SI joint injection with steroid. Her most recent SI joint injection with CT-guided, Marcaine only, and she reports approximately a week of 100% relief. Her  physical exam is essentially the same, with increasing pain and pain now radiating to the left SI joint as well. She would like to move forward with surgical intervention in the form of a right SI joint fusion.   Plan on moving forward with a right SI joint fusion pending clearance from PCP.   Today she states she has stopped seeing Dr. Rolena Infante as she can not afford to pay out of pocket. She is uninsured. .zwff She has completed 12 weeks of physical therapy (2 times per week) and 3 total spinal injections per her report.   Describes pain as fire shooting through her right side. She reports being placed on work restrictions last year which she currently continues on: No bending, stooping, squatting, twisting, pushing, pulling or lifting over 10lbs. Current relieving factors: OTC analgesics, Lidocaine patches ,Tylenol with aleve, bio freeze. She does report sometimes having to use a cane or walker.    History reviewed. No pertinent past medical history.  Past Surgical History:  Procedure Laterality Date  . CHOLECYSTECTOMY      Family History  Problem Relation Age of Onset  . Heart disease Father     Social History   Socioeconomic History  . Marital status: Married    Spouse name: Not on file  . Number of children: Not on file  . Years of education: Not on file  . Highest education level: Not on file  Occupational History  . Not on file  Tobacco Use  . Smoking status: Current Every Day Smoker  . Smokeless tobacco: Never Used  Substance and Sexual Activity  . Alcohol use: No  . Drug use:  No  . Sexual activity: Not Currently  Other Topics Concern  . Not on file  Social History Narrative  . Not on file   Social Determinants of Health   Financial Resource Strain:   . Difficulty of Paying Living Expenses: Not on file  Food Insecurity:   . Worried About Charity fundraiser in the Last Year: Not on file  . Ran Out of Food in the Last Year: Not on file  Transportation Needs:   .  Lack of Transportation (Medical): Not on file  . Lack of Transportation (Non-Medical): Not on file  Physical Activity:   . Days of Exercise per Week: Not on file  . Minutes of Exercise per Session: Not on file  Stress:   . Feeling of Stress : Not on file  Social Connections:   . Frequency of Communication with Friends and Family: Not on file  . Frequency of Social Gatherings with Friends and Family: Not on file  . Attends Religious Services: Not on file  . Active Member of Clubs or Organizations: Not on file  . Attends Archivist Meetings: Not on file  . Marital Status: Not on file     Observations/Objective: Awake, alert and oriented x 3   Review of Systems  Constitutional: Negative for fever, malaise/fatigue and weight loss.  HENT: Negative.  Negative for nosebleeds.   Eyes: Negative.  Negative for blurred vision, double vision and photophobia.  Respiratory: Negative.  Negative for cough and shortness of breath.   Cardiovascular: Negative.  Negative for chest pain, palpitations and leg swelling.  Gastrointestinal: Negative.  Negative for heartburn, nausea and vomiting.  Musculoskeletal: Positive for back pain, joint pain and myalgias.  Neurological: Positive for sensory change. Negative for dizziness, focal weakness, seizures and headaches.  Psychiatric/Behavioral: Negative.  Negative for suicidal ideas.    Assessment and Plan: [name] was seen today for new patient (initial visit).  Diagnoses and all orders for this visit:  Chronic bilateral low back pain with right-sided sciatica -     lidocaine (LIDODERM) 5 %; Place 1 patch onto the skin daily. Remove & Discard patch within 12 hours or as directed by MD Work on losing weight to help reduce back pain. May alternate with heat and ice application for pain relief. May also alternate with acetaminophen and Ibuprofen as prescribed for back pain. Other alternatives include massage, acupuncture and water aerobics.  You  must stay active and avoid a sedentary lifestyle.    Lipid screening -     Lipid panel; Future  Screening for deficiency anemia -     CBC; Future  Screening for metabolic disorder -     GPQ98+YMEB; Future  Encounter for screening for diabetes mellitus -     Hemoglobin A1c; Future     Follow Up Instructions Return in about 2 months (around 07/22/2019).     I discussed the assessment and treatment plan with the patient. The patient was provided an opportunity to ask questions and all were answered. The patient agreed with the plan and demonstrated an understanding of the instructions.   The patient was advised to call back or seek an in-person evaluation if the symptoms worsen or if the condition fails to improve as anticipated.  I provided 20 minutes of non-face-to-face time during this encounter including median intraservice time, reviewing previous notes, labs, imaging, medications and explaining diagnosis and management.  Gildardo Pounds, FNP-BC

## 2019-05-28 ENCOUNTER — Other Ambulatory Visit: Payer: Self-pay

## 2019-05-31 ENCOUNTER — Other Ambulatory Visit: Payer: Self-pay

## 2019-05-31 ENCOUNTER — Ambulatory Visit: Payer: Self-pay | Attending: Nurse Practitioner

## 2019-05-31 DIAGNOSIS — Z131 Encounter for screening for diabetes mellitus: Secondary | ICD-10-CM

## 2019-05-31 DIAGNOSIS — Z130 Encounter for screening for diseases of the blood and blood-forming organs and certain disorders involving the immune mechanism: Secondary | ICD-10-CM

## 2019-05-31 DIAGNOSIS — Z13228 Encounter for screening for other metabolic disorders: Secondary | ICD-10-CM

## 2019-05-31 DIAGNOSIS — Z13220 Encounter for screening for lipoid disorders: Secondary | ICD-10-CM

## 2019-06-01 LAB — CMP14+EGFR
ALT: 12 IU/L (ref 0–32)
AST: 17 IU/L (ref 0–40)
Albumin/Globulin Ratio: 2 (ref 1.2–2.2)
Albumin: 4.6 g/dL (ref 3.8–4.9)
Alkaline Phosphatase: 99 IU/L (ref 39–117)
BUN/Creatinine Ratio: 15 (ref 9–23)
BUN: 10 mg/dL (ref 6–24)
Bilirubin Total: 0.3 mg/dL (ref 0.0–1.2)
CO2: 22 mmol/L (ref 20–29)
Calcium: 10 mg/dL (ref 8.7–10.2)
Chloride: 105 mmol/L (ref 96–106)
Creatinine, Ser: 0.68 mg/dL (ref 0.57–1.00)
GFR calc Af Amer: 115 mL/min/1.73 (ref >59)
GFR calc non Af Amer: 100 mL/min/1.73 (ref >59)
Globulin, Total: 2.3 g/dL (ref 1.5–4.5)
Glucose: 92 mg/dL (ref 65–99)
Potassium: 3.9 mmol/L (ref 3.5–5.2)
Sodium: 143 mmol/L (ref 134–144)
Total Protein: 6.9 g/dL (ref 6.0–8.5)

## 2019-06-01 LAB — CBC
Hematocrit: 42.4 % (ref 34.0–46.6)
Hemoglobin: 14.3 g/dL (ref 11.1–15.9)
MCH: 31.9 pg (ref 26.6–33.0)
MCHC: 33.7 g/dL (ref 31.5–35.7)
MCV: 95 fL (ref 79–97)
Platelets: 321 10*3/uL (ref 150–450)
RBC: 4.48 x10E6/uL (ref 3.77–5.28)
RDW: 12.4 % (ref 11.7–15.4)
WBC: 10.4 10*3/uL (ref 3.4–10.8)

## 2019-06-01 LAB — LIPID PANEL
Chol/HDL Ratio: 3.7 ratio (ref 0.0–4.4)
Cholesterol, Total: 193 mg/dL (ref 100–199)
HDL: 52 mg/dL (ref >39)
LDL Chol Calc (NIH): 117 mg/dL — ABNORMAL HIGH (ref 0–99)
Triglycerides: 135 mg/dL (ref 0–149)
VLDL Cholesterol Cal: 24 mg/dL (ref 5–40)

## 2019-06-01 LAB — HEMOGLOBIN A1C
Est. average glucose Bld gHb Est-mCnc: 114 mg/dL
Hgb A1c MFr Bld: 5.6 % (ref 4.8–5.6)

## 2019-06-28 MED FILL — LIDOCAINE PATCH 5%: 5 | 30 days supply | Qty: 30 | Fill #1

## 2019-08-05 ENCOUNTER — Other Ambulatory Visit: Payer: Self-pay

## 2019-08-05 ENCOUNTER — Ambulatory Visit: Payer: Self-pay | Attending: Nurse Practitioner | Admitting: Nurse Practitioner

## 2019-08-05 ENCOUNTER — Encounter: Payer: Self-pay | Admitting: Nurse Practitioner

## 2019-08-05 VITALS — BP 152/83 | HR 78 | Temp 97.7°F | Ht 62.5 in | Wt 154.0 lb

## 2019-08-05 DIAGNOSIS — Z1211 Encounter for screening for malignant neoplasm of colon: Secondary | ICD-10-CM

## 2019-08-05 DIAGNOSIS — M5441 Lumbago with sciatica, right side: Secondary | ICD-10-CM

## 2019-08-05 DIAGNOSIS — G8929 Other chronic pain: Secondary | ICD-10-CM

## 2019-08-05 MED ORDER — LIDOCAINE 5 % EX PTCH: 1.0000 | MEDICATED_PATCH | CUTANEOUS | 1 refills | Status: DC

## 2019-08-05 MED ORDER — DULOXETINE HCL 60 MG PO CPEP: 60.0000 mg | ORAL_CAPSULE | Freq: Every day | ORAL | 3 refills | Status: DC

## 2019-08-05 MED FILL — DULoxetine HCL 60 MG CPEP: 60 | 30 days supply | Qty: 30 | Fill #0

## 2019-08-05 NOTE — Progress Notes (Signed)
Assessment & Plan:  [name] was seen today for follow-up.  Diagnoses and all orders for this visit:  Chronic bilateral low back pain with right-sided sciatica -     DULoxetine (CYMBALTA) 60 MG capsule; Take 1 capsule (60 mg total) by mouth daily. -     lidocaine (LIDODERM) 5 %; Place 1 patch onto the skin daily.  Colon cancer screening -     Fecal occult blood, imunochemical([facility])    Patient has been counseled on age-appropriate routine health concerns for screening and prevention. These are reviewed and up-to-date. Referrals have been placed accordingly. Immunizations are up-to-date or declined.    Subjective:   Chief Complaint  Patient presents with  . Follow-up    Pt. is here for follow up on back pain. Pt. stated it is still the same, her back pain hurts when she sit down and do activities.    HPI [name] 54 y.o. female presents to office today for follow up to back pain.    Chronic Back Pain She is currently using a lidoderm patch. Pain has somewhat decreased but still present. Worse with prolonged sitting or standing. Alternative therapies: hot baths, epsom salt soaks, aleve, tylenol, bio freeze.    PER ORTHO NOTES ON 02-2019 [name] is a pleasant 54 year old female who was In her normal state of health until 10/10/18 when she was lifting A box at work and began experiencing right-sided low back/buttock pain. She reports 100% relief following her Initial diagnostic right SI joint injection for a few hours. Unfortunately, she reports no relief from her repeated fluoroscopy guided SI joint injection with steroid. Her most recent SI joint injection with CT-guided, Marcaine only, and she reports approximately a week of 100% relief. Her physical exam is essentially the same, with increasing pain and pain now radiating to the left SI joint as well. She would like to move forward with surgical intervention in the form of a right SI joint fusion.   Plan on moving  forward with a right SI joint fusion pending clearance from PCP She could not afford to continue to see Dr. Rolena Infante and was unable to follow up.  She has completed 12 weeks of physical therapy (2 times per week) and 3 total spinal injections per her report.   Describes pain as fire shooting through her right side. She reports being placed on work restrictions last year which she currently continues on: No bending, stooping, squatting, twisting, pushing, pulling or lifting over 10lbs. Current relieving factors: OTC analgesics, Lidocaine patches ,Tylenol with aleve, bio freeze. She does report sometimes having to use a cane or walker     Depression screen [facility] 2/9 08/05/2019 05/22/2019  Decreased Interest 3 0  Down, Depressed, Hopeless 3 0  PHQ - 2 Score 6 0  Altered sleeping 3 -  Tired, decreased energy 0 -  Change in appetite 0 -  Feeling bad or failure about yourself  0 -  Trouble concentrating 1 -  Moving slowly or fidgety/restless 0 -  Suicidal thoughts 0 -  PHQ-9 Score 10 -    Review of Systems  Constitutional: Negative for fever, malaise/fatigue and weight loss.  HENT: Negative.  Negative for nosebleeds.   Eyes: Negative.  Negative for blurred vision, double vision and photophobia.  Respiratory: Negative.  Negative for cough and shortness of breath.   Cardiovascular: Negative.  Negative for chest pain, palpitations and leg swelling.  Gastrointestinal: Negative.  Negative for heartburn, nausea and vomiting.  Musculoskeletal: Positive for back  pain. Negative for myalgias.  Neurological: Negative.  Negative for dizziness, focal weakness, seizures and headaches.  Psychiatric/Behavioral: Negative.  Negative for suicidal ideas.    History reviewed. No pertinent past medical history.  Past Surgical History:  Procedure Laterality Date  . CHOLECYSTECTOMY      Family History  Problem Relation Age of Onset  . Heart disease Father     Social History Reviewed with no changes to be  made today.   Outpatient Medications Prior to Visit  Medication Sig Dispense Refill  . diclofenac (VOLTAREN) 50 MG EC tablet Take 1 tablet (50 mg total) by mouth 2 (two) times daily as needed. (Patient not taking: Reported on 05/22/2019) 60 tablet 0   No facility-administered medications prior to visit.    Allergies  Allergen Reactions  . Morphine And Related   . Penicillins   . Phenergan [Promethazine Hcl]        Objective:    BP (!) 152/83 (BP Location: Left Arm, Patient Position: Sitting, Cuff Size: Normal)   Pulse 78   Temp 97.7 F (36.5 C) (Temporal)   Ht 5' 2.5" (1.588 m)   Wt 154 lb (69.9 kg)   SpO2 97%   BMI 27.72 kg/m  Wt Readings from Last 3 Encounters:  08/05/19 154 lb (69.9 kg)    Physical Exam Vitals and nursing note reviewed.  Constitutional:      Appearance: She is well-developed.  HENT:     Head: Normocephalic and atraumatic.  Cardiovascular:     Rate and Rhythm: Normal rate and regular rhythm.     Heart sounds: Normal heart sounds. No murmur. No friction rub. No gallop.   Pulmonary:     Effort: Pulmonary effort is normal. No tachypnea or respiratory distress.     Breath sounds: Normal breath sounds. No decreased breath sounds, wheezing, rhonchi or rales.  Chest:     Chest wall: No tenderness.  Abdominal:     General: Bowel sounds are normal.     Palpations: Abdomen is soft.  Musculoskeletal:        General: Normal range of motion.     Cervical back: Normal range of motion.     Lumbar back: Tenderness present.       Back:  Skin:    General: Skin is warm and dry.  Neurological:     Mental Status: She is alert and oriented to person, place, and time.     Coordination: Coordination normal.  Psychiatric:        Behavior: Behavior normal. Behavior is cooperative.        Thought Content: Thought content normal.        Judgment: Judgment normal.          Patient has been counseled extensively about nutrition and exercise as well as the  importance of adherence with medications and regular follow-up. The patient was given clear instructions to go to ER or return to medical center if symptoms don't improve, worsen or new problems develop. The patient verbalized understanding.   Follow-up: Return in about 4 weeks (around 09/02/2019) for back pain.   Claiborne Rigg, FNP-BC [facility] and [facility] [place], [place] [phone]

## 2019-08-17 ENCOUNTER — Encounter: Payer: Self-pay | Admitting: Nurse Practitioner

## 2019-09-02 MED FILL — LIDOCAINE PATCH 5%: 5 | 30 days supply | Qty: 30 | Fill #0

## 2019-09-09 ENCOUNTER — Encounter: Payer: Self-pay | Admitting: Nurse Practitioner

## 2019-09-09 ENCOUNTER — Other Ambulatory Visit: Payer: Self-pay

## 2019-09-09 ENCOUNTER — Ambulatory Visit: Payer: Self-pay | Attending: Nurse Practitioner | Admitting: Nurse Practitioner

## 2019-09-09 DIAGNOSIS — F17200 Nicotine dependence, unspecified, uncomplicated: Secondary | ICD-10-CM

## 2019-09-09 DIAGNOSIS — G8929 Other chronic pain: Secondary | ICD-10-CM

## 2019-09-09 DIAGNOSIS — M5441 Lumbago with sciatica, right side: Secondary | ICD-10-CM

## 2019-09-09 MED ORDER — DICLOFENAC SODIUM 50 MG PO TBEC: 50.0000 mg | DELAYED_RELEASE_TABLET | Freq: Two times a day (BID) | ORAL | 2 refills | Status: AC | PRN

## 2019-09-09 MED ORDER — LIDOCAINE 5 % EX PTCH: 1.0000 | MEDICATED_PATCH | CUTANEOUS | 1 refills | Status: AC

## 2019-09-09 MED FILL — DICLOFENAC SOD EC 50 MG TAB: 50 | 30 days supply | Qty: 60 | Fill #0

## 2019-09-09 NOTE — Progress Notes (Signed)
Virtual Visit via Telephone Note Due to national recommendations of social distancing due to [place] 19, telehealth visit is felt to be most appropriate for this patient at this time.  I discussed the limitations, risks, security and privacy concerns of performing an evaluation and management service by telephone and the availability of in person appointments. I also discussed with the patient that there may be a patient responsible charge related to this service. The patient expressed understanding and agreed to proceed.    I connected with [name] on 09/09/19  at   9:10 AM EDT  EDT by telephone and verified that I am speaking with the correct person using two identifiers.   Consent I discussed the limitations, risks, security and privacy concerns of performing an evaluation and management service by telephone and the availability of in person appointments. I also discussed with the patient that there may be a patient responsible charge related to this service. The patient expressed understanding and agreed to proceed.   Location of Patient: Private Residence    Location of Provider: [place] participating in Telemedicine visit: Geryl Rankins FNP-BC [place]    History of Present Illness: Telemedicine visit for: Chronic Back Pain   History of somatic dysfunction of sacroiliac joint. She was first evaluated for her back pain last year in July after she reported lifting a box at work and experiencing low back right sided buttock pain. Treatments have included OTC analgesics, lidocaine patches, diclofenac EC, Tylenol, Aleve and Biofreeze.  Diagnostic right sacroiliac joint injection.  Her treatment plan was to proceed with a right sacroiliac joint fusion however patient stopped seeing the spine specialist as she could not afford to pay out-of-pocket.  She reports completing 12 weeks of physical therapy 2 times per week and 3  total spinal injections.  Describes pain as fire or burning radiating to the right side.  Work related restrictions include no bending, stooping, squatting, twisting, pushing, pulling or lifting over 10 pounds.  Assistive devices sometimes use include a cane or walker.   She was started on cymbalta last month for her chronic low back pain with right sided low back pain. States Cymbalta made her sick to her stomach. Lidocaine patch seems to be providing some relief.  She also notes previous relief of pain with taking diclofenac in the past so we will refill that at this time.  She does need updated imaging and will likely require referral to spine specialist however she is currently uninsured. Patient has been advised to apply for financial assistance and schedule to see our financial counselor.     History reviewed. No pertinent past medical history.  Past Surgical History:  Procedure Laterality Date  . CHOLECYSTECTOMY      Family History  Problem Relation Age of Onset  . Heart disease Father     Social History   Socioeconomic History  . Marital status: Married    Spouse name: Not on file  . Number of children: Not on file  . Years of education: Not on file  . Highest education level: Not on file  Occupational History  . Not on file  Tobacco Use  . Smoking status: Current Every Day Smoker  . Smokeless tobacco: Never Used  Vaping Use  . Vaping Use: Never used  Substance and Sexual Activity  . Alcohol use: No  . Drug use: No  . Sexual activity: Not Currently  Other Topics Concern  .  Not on file  Social History Narrative  . Not on file   Social Determinants of Health   Financial Resource Strain:   . Difficulty of Paying Living Expenses:   Food Insecurity:   . Worried About Programme researcher, broadcasting/film/video in the Last Year:   . Barista in the Last Year:   Transportation Needs:   . Freight forwarder (Medical):   Marland Kitchen Lack of Transportation (Non-Medical):   Physical Activity:    . Days of Exercise per Week:   . Minutes of Exercise per Session:   Stress:   . Feeling of Stress :   Social Connections:   . Frequency of Communication with Friends and Family:   . Frequency of Social Gatherings with Friends and Family:   . Attends Religious Services:   . Active Member of Clubs or Organizations:   . Attends Banker Meetings:   Marland Kitchen Marital Status:      Observations/Objective: Awake, alert and oriented x 3   Review of Systems  Constitutional: Negative for fever, malaise/fatigue and weight loss.  HENT: Negative.  Negative for nosebleeds.   Eyes: Negative.  Negative for blurred vision, double vision and photophobia.  Respiratory: Negative.  Negative for cough and shortness of breath.   Cardiovascular: Negative.  Negative for chest pain, palpitations and leg swelling.  Gastrointestinal: Negative.  Negative for heartburn, nausea and vomiting.  Musculoskeletal: Positive for back pain and joint pain. Negative for myalgias.  Neurological: Negative.  Negative for dizziness, focal weakness, seizures and headaches.  Psychiatric/Behavioral: Negative.  Negative for suicidal ideas.    Assessment and Plan: Leon was seen today for follow-up.  Diagnoses and all orders for this visit:  Chronic bilateral low back pain with right-sided sciatica -     diclofenac (VOLTAREN) 50 MG EC tablet; Take 1 tablet (50 mg total) by mouth 2 (two) times daily as needed. -     lidocaine (LIDODERM) 5 %; Place 1 patch onto the skin daily. Work on losing weight to help reduce back pain. May alternate with heat and ice application for pain relief. May also alternate with acetaminophen and Ibuprofen as prescribed for back pain. Other alternatives include massage, acupuncture and water aerobics.  You must stay active and avoid a sedentary lifestyle.    Tobacco dependence Rilie was counseled on the dangers of tobacco use, and was advised to quit. Reviewed strategies to maximize  success, including removing cigarettes and smoking materials from environment, stress management and support of family/friends as well as pharmacological alternatives including: Wellbutrin, Chantix, Nicotine patch, Nicotine gum or lozenges. Smoking cessation support: smoking cessation hotline: 1-800-QUIT-NOW.  Smoking cessation classes are also available through [facility]. Call [phone] or visit our website at [email or web address].   A total of 2 minutes was spent on counseling for smoking cessation and Kodie is not ready to quit.    Follow Up Instructions Return for NEEDS ORANGE CARD/CAFA APP.     I discussed the assessment and treatment plan with the patient. The patient was provided an opportunity to ask questions and all were answered. The patient agreed with the plan and demonstrated an understanding of the instructions.   The patient was advised to call back or seek an in-person evaluation if the symptoms worsen or if the condition fails to improve as anticipated.  I provided 17 minutes of non-face-to-face time during this encounter including median intraservice time, reviewing previous notes, labs, imaging, medications and explaining diagnosis and management.  Gildardo Pounds, FNP-BC

## 2019-10-18 ENCOUNTER — Ambulatory Visit: Payer: Self-pay | Admitting: Nurse Practitioner

## 2023-02-22 ENCOUNTER — Other Ambulatory Visit: Payer: Self-pay | Admitting: Orthopaedic Surgery

## 2023-02-22 DIAGNOSIS — M5416 Radiculopathy, lumbar region: Secondary | ICD-10-CM

## 2023-03-09 ENCOUNTER — Encounter: Payer: Self-pay | Admitting: Orthopaedic Surgery

## 2023-03-13 ENCOUNTER — Ambulatory Visit
Admission: RE | Admit: 2023-03-13 | Discharge: 2023-03-13 | Disposition: A | Discharge status: Home / Self Care | Payer: Medicare HMO | Source: Ambulatory Visit | Attending: Orthopaedic Surgery | Admitting: Orthopaedic Surgery

## 2023-03-13 DIAGNOSIS — M5416 Radiculopathy, lumbar region: Secondary | ICD-10-CM

## 2023-05-12 ENCOUNTER — Ambulatory Visit: Payer: Medicare HMO | Admitting: Podiatry

## 2023-05-18 ENCOUNTER — Ambulatory Visit: Payer: Medicare HMO | Admitting: Podiatry

## 2023-05-18 ENCOUNTER — Encounter: Payer: Self-pay | Admitting: Podiatry

## 2023-05-18 DIAGNOSIS — M79675 Pain in left toe(s): Secondary | ICD-10-CM

## 2023-05-18 DIAGNOSIS — H543 Unqualified visual loss, both eyes: Secondary | ICD-10-CM | POA: Diagnosis not present

## 2023-05-18 DIAGNOSIS — M79674 Pain in right toe(s): Secondary | ICD-10-CM

## 2023-05-18 DIAGNOSIS — B351 Tinea unguium: Secondary | ICD-10-CM

## 2023-05-18 NOTE — Progress Notes (Signed)
  Subjective:  Patient ID: [name], female    DOB: 06/29/1965,  MRN: [id number]  Chief Complaint  Patient presents with   Nail Problem    Patient is here for RFC     58 y.o. female presents with the above complaint. History confirmed with patient. Patient presenting with pain related to dystrophic thickened elongated nails. Patient is unable to trim own nails related to nail dystrophy, blindness and mobility issues due to back pain. Patient does not have a history of T2DM.  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  Objective:  Physical Exam: warm, good capillary refill nail exam onychomycosis of the toenails, onycholysis, and dystrophic nails DP pulses palpable, PT pulses palpable, and protective sensation intact Left Foot:  Pain with palpation of nails due to elongation and dystrophic growth.  Right Foot: Pain with palpation of nails due to elongation and dystrophic growth.   Assessment:   1. Pain due to onychomycosis of toenails of both feet   2. Blindness of both eyes      Plan:  Patient was evaluated and treated and all questions answered.  #Onychomycosis with pain  -Nails palliatively debrided as below. -Educated on self-care  Procedure: Nail Debridement Rationale: Pain Type of Debridement: manual, sharp debridement. Instrumentation: Nail nipper, rotary burr. Number of Nails: 10  Return in about 3 months (around 08/15/2023) for Routine Foot Care.         Bronwen Betters, DPM [facility] / [facility]

## 2023-08-17 ENCOUNTER — Ambulatory Visit (INDEPENDENT_AMBULATORY_CARE_PROVIDER_SITE_OTHER): Payer: Medicare HMO | Admitting: Podiatry

## 2023-08-17 DIAGNOSIS — Z91199 Patient's noncompliance with other medical treatment and regimen due to unspecified reason: Secondary | ICD-10-CM

## 2023-08-19 NOTE — Progress Notes (Signed)
Patient did not show for scheduled appointment today.
